# Patient Record
Sex: Female | Born: 1990 | Hispanic: Yes | Marital: Single | Smoking: Never smoker
Health system: Southern US, Community
[De-identification: ages and names within clinical notes are randomized; demographics above are authoritative.]

---

## 2015-10-07 ENCOUNTER — Emergency Department (HOSPITAL_COMMUNITY): Payer: PRIVATE HEALTH INSURANCE

## 2015-10-07 ENCOUNTER — Emergency Department (HOSPITAL_COMMUNITY)
Admission: EM | Admit: 2015-10-07 | Discharge: 2015-10-07 | Disposition: A | Discharge status: Home / Self Care | Payer: PRIVATE HEALTH INSURANCE | Attending: Emergency Medicine | Admitting: Emergency Medicine

## 2015-10-07 ENCOUNTER — Encounter (HOSPITAL_COMMUNITY): Payer: Self-pay

## 2015-10-07 DIAGNOSIS — N12 Tubulo-interstitial nephritis, not specified as acute or chronic: Secondary | ICD-10-CM | POA: Diagnosis not present

## 2015-10-07 DIAGNOSIS — R109 Unspecified abdominal pain: Secondary | ICD-10-CM | POA: Diagnosis present

## 2015-10-07 DIAGNOSIS — R000 Tachycardia, unspecified: Secondary | ICD-10-CM | POA: Diagnosis not present

## 2015-10-07 DIAGNOSIS — N200 Calculus of kidney: Secondary | ICD-10-CM

## 2015-10-07 LAB — COMPREHENSIVE METABOLIC PANEL
ALK PHOS: 88 U/L (ref 38–126)
ALT: 12 U/L — AB (ref 14–54)
AST: 17 U/L (ref 15–41)
Albumin: 4.3 g/dL (ref 3.5–5.0)
Anion gap: 7 (ref 5–15)
BILIRUBIN TOTAL: 0.7 mg/dL (ref 0.3–1.2)
BUN: 10 mg/dL (ref 6–20)
CALCIUM: 9.6 mg/dL (ref 8.9–10.3)
CO2: 26 mmol/L (ref 22–32)
Chloride: 106 mmol/L (ref 101–111)
Creatinine, Ser: 0.63 mg/dL (ref 0.44–1.00)
Glucose, Bld: 113 mg/dL — ABNORMAL HIGH (ref 65–99)
Potassium: 4.3 mmol/L (ref 3.5–5.1)
Sodium: 139 mmol/L (ref 135–145)
Total Protein: 7.6 g/dL (ref 6.5–8.1)

## 2015-10-07 LAB — URINALYSIS, ROUTINE W REFLEX MICROSCOPIC
BILIRUBIN URINE: NEGATIVE
GLUCOSE, UA: NEGATIVE mg/dL
KETONES UR: NEGATIVE mg/dL
NITRITE: NEGATIVE
PH: 6 (ref 5.0–8.0)
PROTEIN: NEGATIVE mg/dL
Specific Gravity, Urine: 1.006 (ref 1.005–1.030)

## 2015-10-07 LAB — CBC
HEMATOCRIT: 42.4 % (ref 36.0–46.0)
Hemoglobin: 13.8 g/dL (ref 12.0–15.0)
MCH: 29.1 pg (ref 26.0–34.0)
MCHC: 32.5 g/dL (ref 30.0–36.0)
MCV: 89.3 fL (ref 78.0–100.0)
Platelets: 185 10*3/uL (ref 150–400)
RBC: 4.75 MIL/uL (ref 3.87–5.11)
RDW: 13.4 % (ref 11.5–15.5)
WBC: 12.3 10*3/uL — AB (ref 4.0–10.5)

## 2015-10-07 LAB — URINE MICROSCOPIC-ADD ON

## 2015-10-07 LAB — I-STAT CG4 LACTIC ACID, ED: LACTIC ACID, VENOUS: 0.72 mmol/L (ref 0.5–2.0)

## 2015-10-07 LAB — POC URINE PREG, ED: Preg Test, Ur: NEGATIVE

## 2015-10-07 MED ORDER — MORPHINE SULFATE (PF) 4 MG/ML IV SOLN
4.0000 mg | Freq: Once | INTRAVENOUS | Status: AC
  Administered 2015-10-07: 4 mg via INTRAVENOUS
  Filled 2015-10-07: qty 1

## 2015-10-07 MED ORDER — AMOXICILLIN-POT CLAVULANATE 875-125 MG PO TABS
1.0000 | ORAL_TABLET | Freq: Once | ORAL | Status: AC
  Administered 2015-10-07: 1 via ORAL
  Filled 2015-10-07: qty 1

## 2015-10-07 MED ORDER — MORPHINE SULFATE (PF) 4 MG/ML IV SOLN: 4.0000 mg | Freq: Once | INTRAVENOUS | Status: DC

## 2015-10-07 MED ORDER — LEVOFLOXACIN 750 MG PO TABS: 750.0000 mg | ORAL_TABLET | Freq: Once | ORAL | Status: DC

## 2015-10-07 MED ORDER — AMOXICILLIN-POT CLAVULANATE 875-125 MG PO TABS: 1.0000 | ORAL_TABLET | Freq: Two times a day (BID) | ORAL | Status: AC

## 2015-10-07 MED ORDER — LEVOFLOXACIN 750 MG PO TABS: 750.0000 mg | ORAL_TABLET | Freq: Every day | ORAL | Status: DC

## 2015-10-07 MED ORDER — ONDANSETRON HCL 4 MG/2ML IJ SOLN
4.0000 mg | Freq: Once | INTRAMUSCULAR | Status: AC
  Administered 2015-10-07: 4 mg via INTRAVENOUS
  Filled 2015-10-07: qty 2

## 2015-10-07 MED ORDER — KETOROLAC TROMETHAMINE 30 MG/ML IJ SOLN: 30.0000 mg | Freq: Once | INTRAMUSCULAR | Status: DC

## 2015-10-07 MED ORDER — SODIUM CHLORIDE 0.9 % IV BOLUS (SEPSIS)
1000.0000 mL | Freq: Once | INTRAVENOUS | Status: AC
  Administered 2015-10-07: 1000 mL via INTRAVENOUS

## 2015-10-07 NOTE — ED Notes (Signed)
Patient transported to CT 

## 2015-10-07 NOTE — ED Notes (Signed)
Patient complains of left flank pain since Saturday, denies urinary symptoms. Pain worse with movement

## 2015-10-07 NOTE — ED Provider Notes (Signed)
CSN: [id number]     Arrival date & time 10/07/15  1536 History   First MD Initiated Contact with Patient 10/07/15 1940     Chief Complaint  Patient presents with  . Flank Pain   (Consider location/radiation/quality/duration/timing/severity/associated sxs/prior Treatment) HPI 25 y.o. female presents to the Emergency Department today complaining of left flank pain with onset on Sunday. Notes worsening symptoms on Monday with fever, chills, nausea. No V/D. No CP/SOB/ABD pain. Notes pain is left flank and worsening with movement. States pain is 8/10 at rest and 10/10 with movement with turning. No hx stones. Has taken ibuprofen with minimal relief. No urinary symptoms. No dysuria. No hematuria. No vaginal bleeding/discharge. No other symptoms noted.   History reviewed. No pertinent past medical history. History reviewed. No pertinent past surgical history. No family history on file. Social History  Substance Use Topics  . Smoking status: Never Smoker   . Smokeless tobacco: None  . Alcohol Use: None   OB History    No data available     Review of Systems ROS reviewed and all are negative for acute change except as noted in the HPI.  Allergies  Penicillins; Sulfa antibiotics; and Tramadol  Home Medications   Prior to Admission medications   Medication Sig Start Date End Date Taking? Authorizing Provider  medroxyPROGESTERone (PROVERA) 10 MG tablet Take 10 mg by mouth daily.   Yes Historical Provider, MD   BP 114/71 mmHg  Pulse 117  Temp(Src) 99.7 F (37.6 C) (Oral)  Resp 16  Ht 5' 1.42" (1.56 m)  Wt 58 kg  BMI 23.83 kg/m2  SpO2 98%   Physical Exam  Constitutional: She is oriented to person, place, and time. She appears well-developed and well-nourished.  HENT:  Head: Normocephalic and atraumatic.  Eyes: EOM are normal. Pupils are equal, round, and reactive to light.  Neck: Normal range of motion. Neck supple. No tracheal deviation present.  Cardiovascular: Regular rhythm,  normal heart sounds and intact distal pulses.  Tachycardia present.   Pulmonary/Chest: Effort normal and breath sounds normal. No respiratory distress. She has no wheezes. She has no rales. She exhibits no tenderness.  Abdominal: Soft. Normal appearance and bowel sounds are normal. There is no tenderness. There is CVA tenderness (left). There is no rigidity, no rebound, no guarding, no tenderness at McBurney's point and negative Murphy's sign.  Musculoskeletal: Normal range of motion.  Neurological: She is alert and oriented to person, place, and time.  Skin: Skin is warm and dry.  Psychiatric: She has a normal mood and affect. Her behavior is normal. Thought content normal.  Nursing note and vitals reviewed.  ED Course  Procedures (including critical care time) Labs Review Labs Reviewed  URINALYSIS, ROUTINE W REFLEX MICROSCOPIC (NOT AT [facility]) - Abnormal; Notable for the following:    APPearance HAZY (*)    Hgb urine dipstick SMALL (*)    Leukocytes, UA SMALL (*)    All other components within normal limits  URINE MICROSCOPIC-ADD ON - Abnormal; Notable for the following:    Squamous Epithelial / LPF 0-5 (*)    Bacteria, UA MANY (*)    All other components within normal limits  CBC - Abnormal; Notable for the following:    WBC 12.3 (*)    All other components within normal limits  COMPREHENSIVE METABOLIC PANEL - Abnormal; Notable for the following:    Glucose, Bld 113 (*)    ALT 12 (*)    All other components within normal limits  POC URINE PREG, ED  I-STAT CG4 LACTIC ACID, ED   Imaging Review Ct Renal Stone Study  10/07/2015  CLINICAL DATA:  Left-sided flank pain. Bacteria and white blood cells in urinalysis. EXAM: CT ABDOMEN AND PELVIS WITHOUT CONTRAST TECHNIQUE: Multidetector CT imaging of the abdomen and pelvis was performed following the standard protocol without IV contrast. COMPARISON:  None. FINDINGS: Lower chest:  Lung bases are clear. Hepatobiliary: Normal appearance of the  liver and gallbladder. Pancreas: Normal appearance of the pancreas without inflammation or duct dilatation. Spleen: Normal appearance of spleen without enlargement. Adrenals/Urinary Tract: Normal adrenal glands. 5 mm stone in the right kidney lower pole region without hydronephrosis. Normal appearance of the left kidney without hydronephrosis. No definite ureter stones. Urinary bladder is decompressed. Stomach/Bowel: Normal appearance of the bowel structures and no evidence for obstruction. Vascular/Lymphatic: No gross abnormality to the vascular structures. No suspicious lymphadenopathy. Reproductive: No gross abnormality to the uterus or adnexal tissue on this noncontrast examination. Other: No significant free fluid.  No free air. Musculoskeletal: No acute bone abnormality. IMPRESSION: Nonobstructive right kidney stone. Electronically Signed   By: Richarda Overlie M.D.   On: 10/07/2015 22:05   I have personally reviewed and evaluated these images and lab results as part of my medical decision-making.   EKG Interpretation None      MDM  I have reviewed and evaluated the relevant laboratory values I have reviewed and evaluated the relevant imaging studies.  I have reviewed the relevant previous healthcare records. I obtained HPI from historian. Patient discussed with supervising physician  ED Course:  Assessment: Pt is a 25yF who presents with left flank pain with onset on Sunday. Fever, chills and nausea. On exam, pt in NAD. Nontoxic/nonseptic appearing. VS with tachycardia. Low grade temp 99.56F. Lungs CTA. Heart RRR. Abdomen nontender soft. CVA tenderness Left flank. Concern for Pyelo vs Stone. UA with WBC, Bacteria. iStat Lactate negative. CBC/BMP with leukocytosis 12.3. CT Renal showed no hydronephrosis. Non Obstructive right nephrolithiasis noted. Given fluids and analgesia. Will treat for Pyelonephritis based on labs. Notified of incidental stone. Plan is to DC home with ABX and follow up to PCP.  Given Augmentin due to currently breastfeeding. Confirmed with Pharmacy. At time of discharge, Patient is in no acute distress. Vital Signs are stable. Patient is able to ambulate. Patient able to tolerate PO.    Disposition/Plan:  DC Home Additional Verbal discharge instructions given and discussed with patient.  Pt Instructed to f/u with PCP in the next week for evaluation and treatment of symptoms. Return precautions given Pt acknowledges and agrees with plan  Supervising Physician Benjiman Core, MD   Final diagnoses:  Pyelonephritis  Nephrolithiasis      Audry Pili, PA-C 10/07/15 2230  Benjiman Core, MD 10/07/15 2326

## 2015-10-07 NOTE — Discharge Instructions (Signed)
Please read and follow all provided instructions.  Your diagnoses today include:  1. Pyelonephritis   2. Nephrolithiasis    Tests performed today include:  Vital signs. See below for your results today.   CT Scan  Medications prescribed:   Take as prescribed   Home care instructions:  Follow any educational materials contained in this packet.  You have a 5mm Kidney Stone on your right side. It should pass overtime. Follow up with your PCP if symptoms arise  Follow-up instructions: Please follow-up with your primary care provider for further evaluation of symptoms and treatment   Return instructions:   Please return to the Emergency Department if you do not get better, if you get worse, or new symptoms OR  - Fever (temperature greater than 101.5F)  - Bleeding that does not stop with holding pressure to the area    -Severe pain (please note that you may be more sore the day after your accident)  - Chest Pain  - Difficulty breathing  - Severe nausea or vomiting  - Inability to tolerate food and liquids  - Passing out  - Skin becoming red around your wounds  - Change in mental status (confusion or lethargy)  - New numbness or weakness     Please return if you have any other emergent concerns.  Additional Information:  Your vital signs today were: BP 111/84 mmHg   Pulse 103   Temp(Src) 99.7 F (37.6 C) (Oral)   Resp 16   Ht 5' 1.42" (1.56 m)   Wt 58 kg   BMI 23.83 kg/m2   SpO2 99%   LMP 09/23/2015 If your blood pressure (BP) was elevated above 135/85 this visit, please have this repeated by your doctor within one month. ---------------

## 2017-02-03 IMAGING — CT CT RENAL STONE PROTOCOL
2 of 4 series · 16 of 46 positions shown, 18 images · non-contrast
Comparison: None.

CLINICAL DATA: Left-sided flank pain. Bacteria and white blood
cells in urinalysis.

EXAM:
CT ABDOMEN AND PELVIS WITHOUT CONTRAST
TECHNIQUE: Multidetector CT imaging of the abdomen and pelvis was performed
following the standard protocol without IV contrast.

[Series 2: stone study 5.0 i30f 1 · axial · 0.60mm/px · z∈[+556,+901]mm · 13 of 76 slices shown, 15 images]
[im 4/76  soft-tissue]
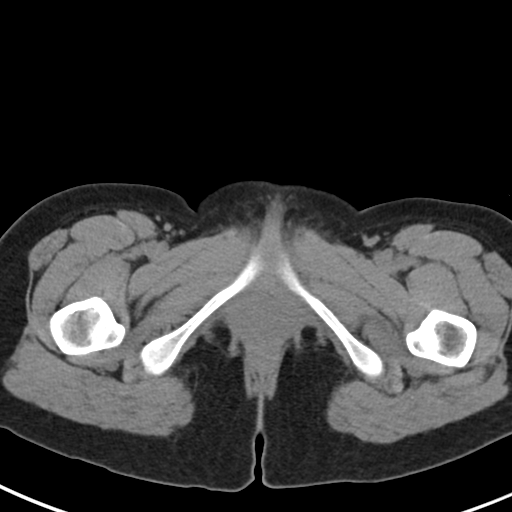
[im 4/76  bone]
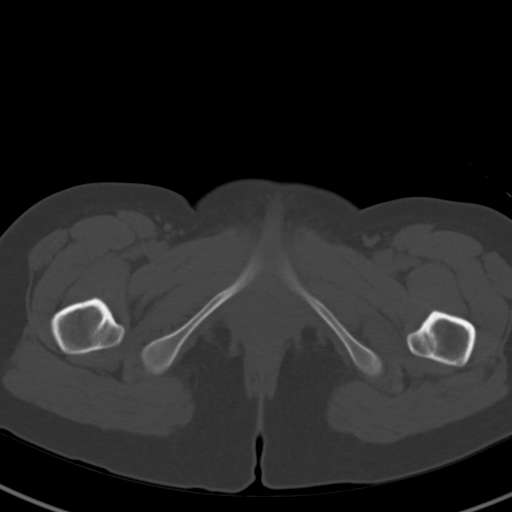
[im 10/76  soft-tissue]
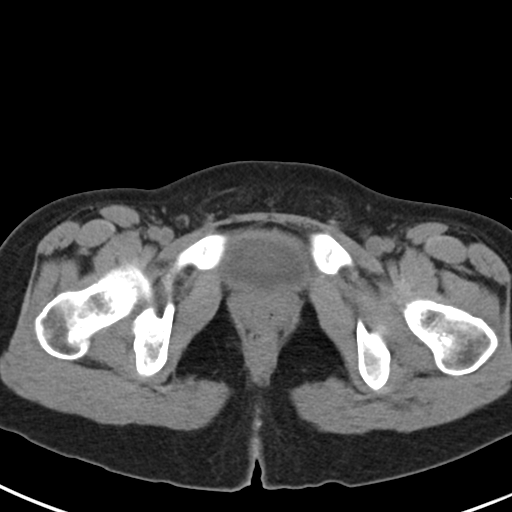
[im 16/76  soft-tissue]
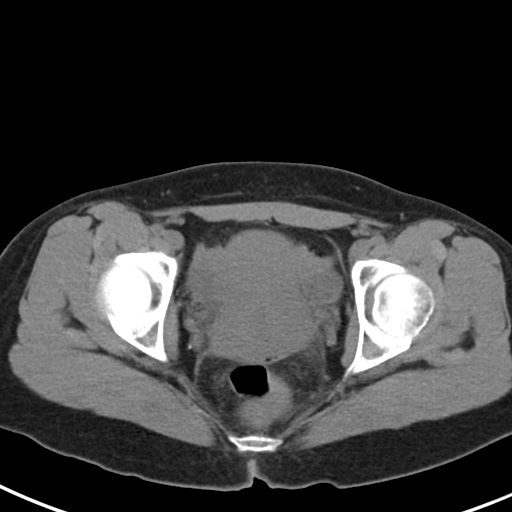
[im 22/76  soft-tissue]
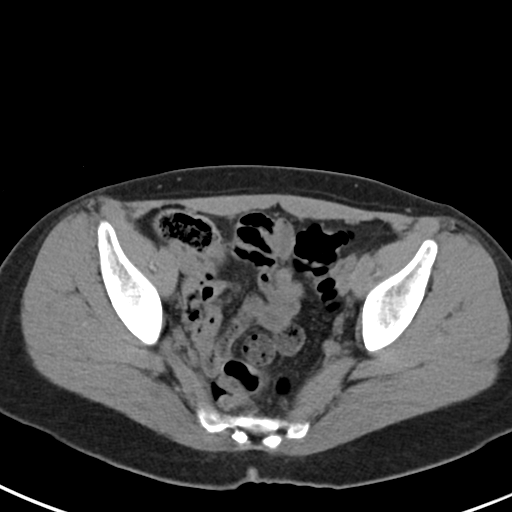
[im 28/76  soft-tissue]
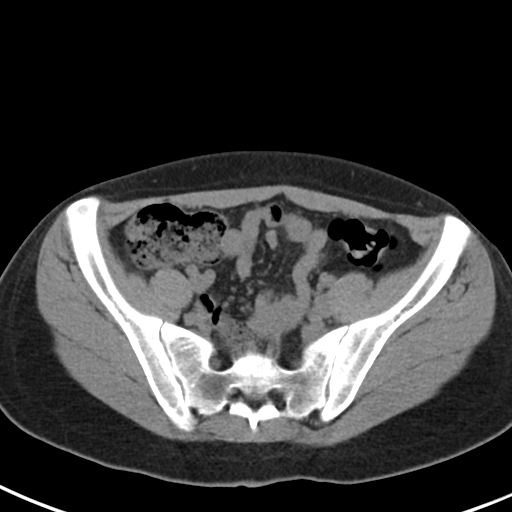
[im 34/76  soft-tissue]
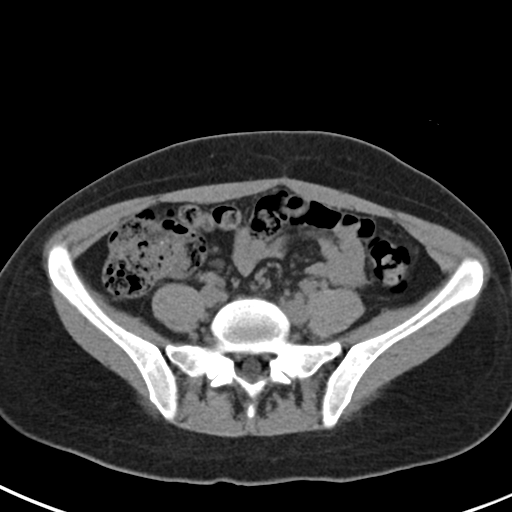
[im 40/76  soft-tissue]
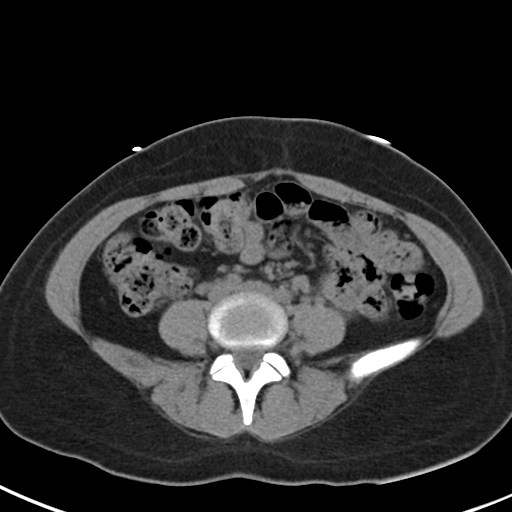
[im 43/76  soft-tissue]
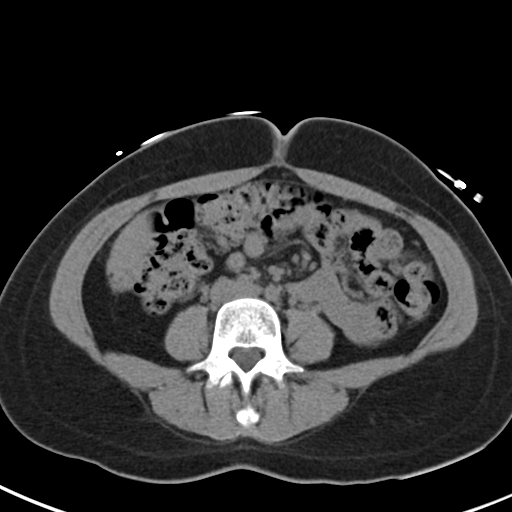
[im 49/76  soft-tissue]
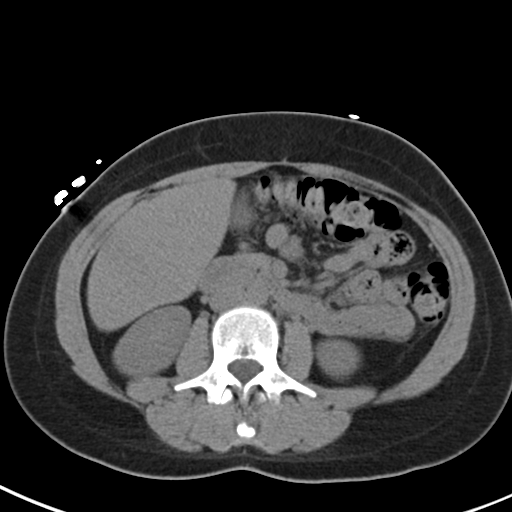
[im 49/76  bone]
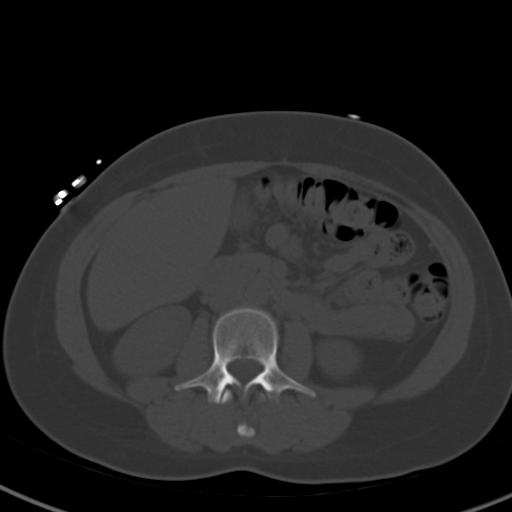
[im 55/76  soft-tissue]
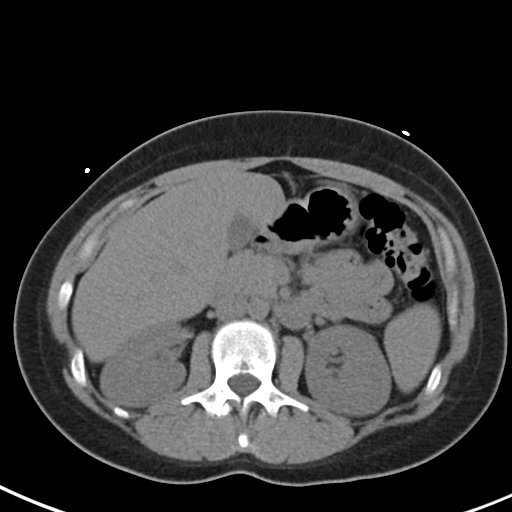
[im 61/76  soft-tissue]
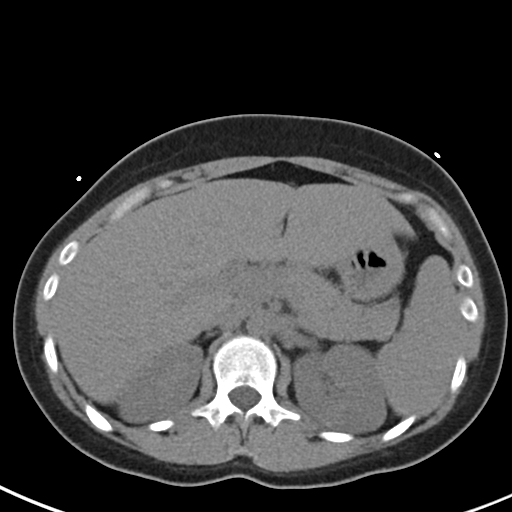
[im 67/76  soft-tissue]
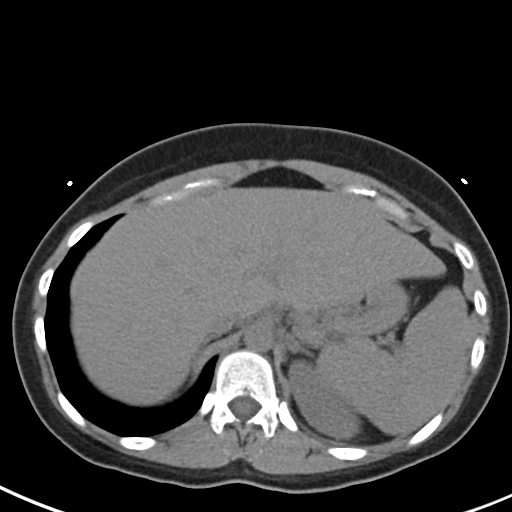
[im 73/76  soft-tissue]
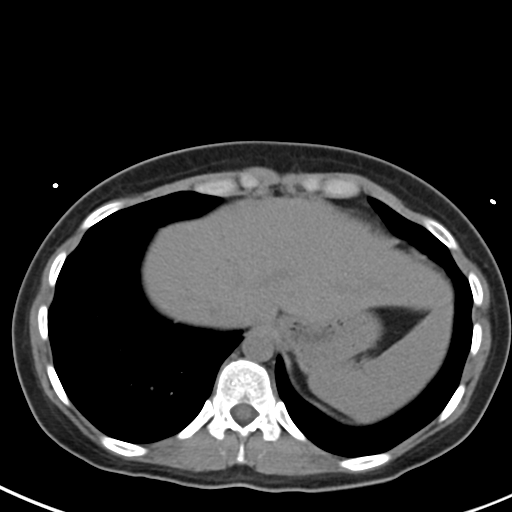

[Series 5: coronal soft tissue · coronal · 0.57mm/px · 3 of 63 slices shown]
[im 21/63  soft-tissue]
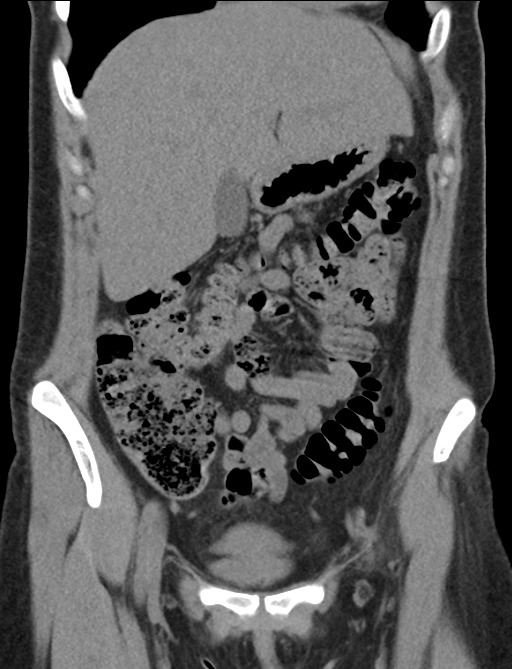
[im 28/63  soft-tissue]
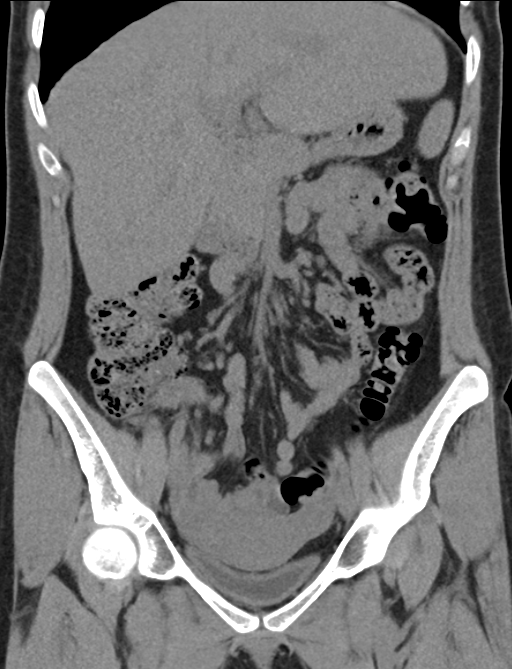
[im 35/63  soft-tissue]
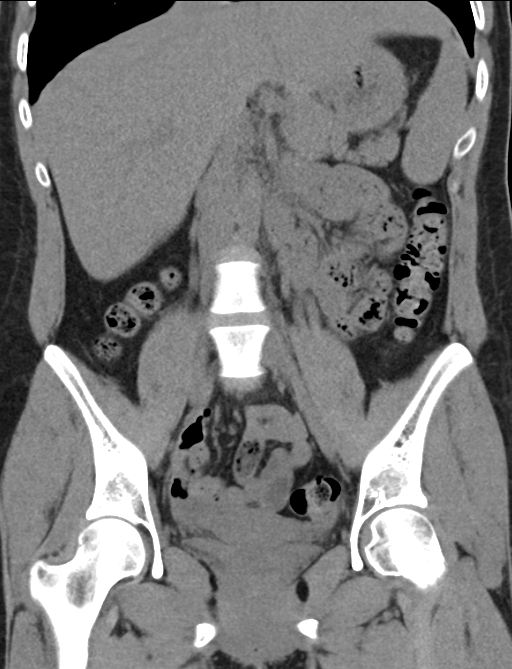

[16 of 46 positions shown; findings below may reference images not displayed]

FINDINGS: Lower chest:  Lung bases are clear.

Hepatobiliary: Normal appearance of the liver and gallbladder.

Pancreas: Normal appearance of the pancreas without inflammation or
duct dilatation.

Spleen: Normal appearance of spleen without enlargement.

Adrenals/Urinary Tract: Normal adrenal glands. 5 mm stone in the
right kidney lower pole region without hydronephrosis. Normal
appearance of the left kidney without hydronephrosis. No definite
ureter stones. Urinary bladder is decompressed.

Stomach/Bowel: Normal appearance of the bowel structures and no
evidence for obstruction.

Vascular/Lymphatic: No gross abnormality to the vascular structures.
No suspicious lymphadenopathy.

Reproductive: No gross abnormality to the uterus or adnexal tissue
on this noncontrast examination.

Other: No significant free fluid.  No free air.

Musculoskeletal: No acute bone abnormality.
IMPRESSION: Nonobstructive right kidney stone.
